# Patient Record
Sex: Male | Born: 2005 | Hispanic: Yes | Marital: Single | Smoking: Never smoker
Health system: Southern US, Community
[De-identification: ages and names within clinical notes are randomized; demographics above are authoritative.]

## PROBLEM LIST (undated history)

## (undated) DIAGNOSIS — N471 Phimosis: Secondary | ICD-10-CM

## (undated) HISTORY — DX: Phimosis: N47.1

---
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
## 2023-01-25 DIAGNOSIS — F332 Major depressive disorder, recurrent severe without psychotic features: Secondary | ICD-10-CM | POA: Diagnosis not present

## 2023-01-25 DIAGNOSIS — F410 Panic disorder [episodic paroxysmal anxiety] without agoraphobia: Secondary | ICD-10-CM | POA: Diagnosis not present

## 2023-01-28 ENCOUNTER — Other Ambulatory Visit: Payer: Self-pay | Admitting: Pediatrics

## 2023-01-28 DIAGNOSIS — E559 Vitamin D deficiency, unspecified: Secondary | ICD-10-CM

## 2023-02-03 DIAGNOSIS — H52209 Unspecified astigmatism, unspecified eye: Secondary | ICD-10-CM | POA: Insufficient documentation

## 2023-02-08 DIAGNOSIS — F410 Panic disorder [episodic paroxysmal anxiety] without agoraphobia: Secondary | ICD-10-CM | POA: Diagnosis not present

## 2023-02-08 DIAGNOSIS — F332 Major depressive disorder, recurrent severe without psychotic features: Secondary | ICD-10-CM | POA: Diagnosis not present

## 2023-02-22 DIAGNOSIS — F410 Panic disorder [episodic paroxysmal anxiety] without agoraphobia: Secondary | ICD-10-CM | POA: Diagnosis not present

## 2023-02-22 DIAGNOSIS — F332 Major depressive disorder, recurrent severe without psychotic features: Secondary | ICD-10-CM | POA: Diagnosis not present

## 2023-02-27 ENCOUNTER — Encounter: Payer: Self-pay | Admitting: Pediatrics

## 2023-02-27 ENCOUNTER — Ambulatory Visit (INDEPENDENT_AMBULATORY_CARE_PROVIDER_SITE_OTHER): Payer: Medicaid Other | Admitting: Pediatrics

## 2023-02-27 VITALS — Wt 345.0 lb

## 2023-02-27 DIAGNOSIS — Z131 Encounter for screening for diabetes mellitus: Secondary | ICD-10-CM

## 2023-02-27 DIAGNOSIS — F419 Anxiety disorder, unspecified: Secondary | ICD-10-CM | POA: Diagnosis not present

## 2023-02-27 DIAGNOSIS — R7989 Other specified abnormal findings of blood chemistry: Secondary | ICD-10-CM | POA: Diagnosis not present

## 2023-02-27 NOTE — Patient Instructions (Signed)
Adult Primary Care Clinics Name Criteria Services   [facility]  Address: [address] [place], [place] [address]  Phone: [phone] Hours: Monday - Friday 9 AM -6 PM  Types of insurance accepted:  Commercial insurance [facility] (orange card) Medicaid Medicare Uninsured  Language services:  Video and phone interpreters available   Ages 18 and older    Adult primary care Onsite pharmacy Integrated behavioral health Financial assistance counseling Walk-in hours for established patients  Financial assistance counseling hours: Tuesdays 2:00PM - 5:00PM  Thursday 8:30AM - 4:30PM  Space is limited, 10 on Tuesday and 20 on Thursday. It's on first come first serve basis  Name Criteria Services   [facility]  Address: [address] [place], [place] [address]  Phone: [phone]  Hours: Monday - Friday 8:30 AM - 5 PM  Types of insurance accepted:  Commercial insurance Medicaid Medicare Uninsured  Language services:  Video and phone interpreters available   All ages - newborn to adult   Primary care for all ages (children and adults) Integrated behavioral health Nutritionist Financial assistance counseling   Name Criteria Services   [facility]  Located on the ground floor of [facility]  Address: [address]  [place],  [place]  [address]  Phone: [phone]  Hours: Monday - Friday 8:15 AM - 5 PM  Types of insurance accepted:  Commercial insurance Medicaid Medicare Uninsured  Language services:  Video and phone interpreters available   Ages 18 and older   Adult primary care Nutritionist Certified Diabetes Educator  Integrated behavioral health Financial assistance counseling   Name Criteria Services   [facility]  Address: [address] [place], [place] [address]  Phone:  [phone]  Hours: Monday - Friday 8:30 AM - 5 PM    Types of insurance accepted:  Commercial insurance Medicaid Medicare Uninsured  Language services:  Video and phone interpreters available   All ages - newborn to adult   Primary care for all ages (children and adults) Integrated behavioral health Financial assistance counseling      

## 2023-02-27 NOTE — Progress Notes (Signed)
Subjective:     [name] [name], is a 17 y.o. male  HPI  Chief Complaint  Patient presents with   Follow-up    labs   Last well exam 10/2022: Since last visit: optometry astigmatism, glasses optional--FU appt in 2 year Not driving   At last visit:  Anxiety and Depression. Seeing psychiatry, Dr Jackquline Berlin His current medicines include:   amphetamine salt 40-- (not sure brand name Remmeron (Mirtaxipine) Trinellix- (Vortioxetine)  His psychiatrist recommended TMS: Transcranial magnetic stimulation, but Khyle did not want to do it  He is having frequent follow-up as his medicines have been changing and they were gradually increasing his amphetamine dosage He still spends most of his time in his room He still does not talk much with the family After graduation in August from high school, he has not yet started to look for a job.  He is not sure what he wants to do Mother says she sees no difference on his current medicines, but he is much more animated and participating in the visit with me  Academics Didn't  pass one class at the end of 12th grade Took summer school, passed and graduated in August [employer]   His BMI is increased since last visit 51--I suspect he is less active now is no longer in school  At most recent laboratory evaluation,  10/2022 TSH high 7.28 with normal T4--at risk for becoming hypothyroid Vit D low 12   HIV, ALT, A1C, normal Cholesterol : not fasting with increased TG, , normal LDL, HDL at 45  Hasn't got to any therapy--: family solutions been on the list for months but has not heard from them Sister goes to [facility] [place]-  Not Taking vit D --no keeps forgeting  History and Problem List: An has Mild persistent asthma; Obesity; Elevated blood pressure reading without diagnosis of hypertension; Second hand smoke exposure; Phimosis; Acanthosis nigricans; Needle phobia; Generalized anxiety disorder; Hearing examination; Failed  hearing screening; Allergic rhinitis; and Astigmatism on their problem list.  Inmer  has a past medical history of Asthma and Phimosis (2.10.14).     Objective:     Wt (!) 345 lb (156.5 kg)   BMI 51.10 kg/m   Physical Exam Constitutional:      Appearance: He is well-developed. He is obese.     Comments: Looks at me, responds to questions promptly, looks of his medicines on the Internet--- all much more engaged in process than previously  HENT:     Head: Normocephalic and atraumatic.     Nose: Nose normal.     Mouth/Throat:     Mouth: Mucous membranes are moist.     Pharynx: Oropharynx is clear.  Eyes:     Conjunctiva/sclera: Conjunctivae normal.  Neck:     Thyroid: No thyromegaly.  Cardiovascular:     Rate and Rhythm: Normal rate and regular rhythm.     Heart sounds: Normal heart sounds. No murmur heard. Pulmonary:     Effort: Pulmonary effort is normal.     Breath sounds: Normal breath sounds.  Abdominal:     Palpations: Abdomen is soft.     Tenderness: There is no abdominal tenderness.  Musculoskeletal:     Cervical back: Normal range of motion.  Lymphadenopathy:     Cervical: No cervical adenopathy.  Skin:    General: Skin is warm and dry.     Findings: No rash.  Neurological:     Mental Status: He is alert.  Assessment & Plan:   1. 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  He is likely to need thyroid at some time in the near future.  Continue to repeat TSH every 3 to 6 months  - TSH + free T4  2. Low vitamin D level  Very little outdoor activity Has not been taking vitamin D Strongly recommend taking vitamin D 2000-3000 international units daily  - VITAMIN D 25 Hydroxy (Vit-D Deficiency, Fractures)  3. Anxiety disorder, unspecified type  Medicines managed by psychiatry Mother has not seen significant changes at home, but I am impressed that he is more engaged with me than in  the past  - Ambulatory referral to Behavioral Health--recommend family services of the [place].  His sister goes there, and mother thinks she might be able to get him a therapist or sooner  I strongly recommend a therapist.  He will be difficult for him to reach outside the house to seek a job, and a therapist can supportive in that process  4. Screening for diabetes mellitus He remains at high risk for diabetes mellitus due to his severe obesity - HgB A1c  Now that he is 16 and graduated from high school, he should start seeking care with an adult PCP.  Provided with a list of adult PCP.  They have a waiting list and we will provide him with care until he gets into the clinic  Supportive care and return precautions reviewed.  Time spent reviewing chart in preparation for visit:  5 minutes Time spent face-to-face with patient: 20 minutes Time spent not face-to-face with patient for documentation and care coordination on date of service: 5 minutes   [name], MD

## 2023-02-28 LAB — HEMOGLOBIN A1C
Hgb A1c MFr Bld: 5.7 %{Hb} — ABNORMAL HIGH (ref <5.7)
Mean Plasma Glucose: 117 mg/dL
eAG (mmol/L): 6.5 mmol/L

## 2023-02-28 LAB — VITAMIN D 25 HYDROXY (VIT D DEFICIENCY, FRACTURES): Vit D, 25-Hydroxy: 10 ng/mL — ABNORMAL LOW (ref 30–100)

## 2023-02-28 LAB — TSH+FREE T4: TSH W/REFLEX TO FT4: 4.28 m[IU]/L (ref 0.50–4.30)

## 2023-03-01 DIAGNOSIS — F332 Major depressive disorder, recurrent severe without psychotic features: Secondary | ICD-10-CM | POA: Diagnosis not present

## 2023-03-01 DIAGNOSIS — F410 Panic disorder [episodic paroxysmal anxiety] without agoraphobia: Secondary | ICD-10-CM | POA: Diagnosis not present

## 2023-03-06 ENCOUNTER — Telehealth: Payer: Self-pay | Admitting: Pediatrics

## 2023-03-06 DIAGNOSIS — R7989 Other specified abnormal findings of blood chemistry: Secondary | ICD-10-CM

## 2023-03-06 DIAGNOSIS — R7303 Prediabetes: Secondary | ICD-10-CM

## 2023-03-06 NOTE — Telephone Encounter (Signed)
Recent visit to recheck thyroid (hx of increased TSH with normal free T 4)  And A1c , newly elevated into Pre-DM range  Also has low Vit D  Offered nutritionist and endocrine. Mother would like to see an endocrinologist

## 2023-05-10 DIAGNOSIS — F410 Panic disorder [episodic paroxysmal anxiety] without agoraphobia: Secondary | ICD-10-CM | POA: Diagnosis not present

## 2023-05-10 DIAGNOSIS — F332 Major depressive disorder, recurrent severe without psychotic features: Secondary | ICD-10-CM | POA: Diagnosis not present

## 2023-05-31 ENCOUNTER — Ambulatory Visit: Payer: Self-pay | Admitting: Pediatrics

## 2023-06-05 ENCOUNTER — Encounter: Payer: Self-pay | Admitting: Pediatrics

## 2023-06-05 ENCOUNTER — Ambulatory Visit (INDEPENDENT_AMBULATORY_CARE_PROVIDER_SITE_OTHER): Payer: Medicaid Other | Admitting: Pediatrics

## 2023-06-05 VITALS — BP 120/74 | Ht 69.13 in | Wt 345.5 lb

## 2023-06-05 DIAGNOSIS — E559 Vitamin D deficiency, unspecified: Secondary | ICD-10-CM

## 2023-06-05 DIAGNOSIS — J4530 Mild persistent asthma, uncomplicated: Secondary | ICD-10-CM

## 2023-06-05 DIAGNOSIS — Z6841 Body Mass Index (BMI) 40.0 and over, adult: Secondary | ICD-10-CM | POA: Diagnosis not present

## 2023-06-05 DIAGNOSIS — R7989 Other specified abnormal findings of blood chemistry: Secondary | ICD-10-CM

## 2023-06-05 DIAGNOSIS — R7303 Prediabetes: Secondary | ICD-10-CM

## 2023-06-05 DIAGNOSIS — L853 Xerosis cutis: Secondary | ICD-10-CM

## 2023-06-05 DIAGNOSIS — J309 Allergic rhinitis, unspecified: Secondary | ICD-10-CM

## 2023-06-05 MED ORDER — VENTOLIN HFA 108 (90 BASE) MCG/ACT IN AERS: 2.0000 | INHALATION_SPRAY | Freq: Four times a day (QID) | RESPIRATORY_TRACT | 0 refills | Status: DC | PRN

## 2023-06-05 MED ORDER — CETIRIZINE HCL 10 MG PO TABS: 10.0000 mg | ORAL_TABLET | Freq: Every day | ORAL | 11 refills | Status: AC

## 2023-06-05 MED ORDER — FLUTICASONE PROPIONATE 50 MCG/ACT NA SUSP: 2.0000 | Freq: Every day | NASAL | 11 refills | Status: AC

## 2023-06-05 NOTE — Progress Notes (Signed)
 Subjective:     [name] [name], is a 18 y.o. male  HPI  Chief Complaint  Patient presents with   Follow-up    Check thyroid  levels and sugar    Consent from patient to speak with mother regarding his concerns today  Last well visit 10/2022  Hx of severe anxiety --poorly responsive to fluoxetine  and sertraline , now managed by psychiatry last visit in Epic 09/2022  Seen most recently at this clinic 02/2023 with concern for abnormal TSH Also hx of Vit D deficiency --forgetting to take supplements Abnormal lipid panel 10/2022: (low HDL and Hi Triglycerides , likely non-fasting)   Also hx of asthma 04/2022  Currently using meds Vortioxetine (trintellix) Mirtazapine 30 (remeron ) Does seem help with sleep better (which was a problem) Mom not note a difference in his activity  He feel no difference : still anxiety and depression Discussing electrical therapy--TMS--daily  Starting with a new therapist first  Also has mild cold symptoms today Mother is concerned it is bringing up his asthma Cough a little  More about one week Fever: no , no chills No vomiting, no diarrhea Cough seems like asthma Hasn't tried his albuterol   History and Problem List: [name] has Mild persistent asthma; Obesity; Elevated blood-pressure reading without diagnosis of hypertension; Second hand smoke exposure; Phimosis; Acanthosis nigricans; Needle phobia; Generalized anxiety disorder; Hearing examination; Failed hearing screening; Allergic rhinitis; and Astigmatism on their problem list.  [name]  has a past medical history of Asthma and Phimosis (2.10.14).     Objective:     BP 120/74 (BP Location: Left Arm, Patient Position: Sitting, Cuff Size: Large)   Ht 5' 9.13 (1.756 m)   Wt (!) 345 lb 8 oz (156.7 kg)   BMI 50.82 kg/m   Physical Exam Constitutional:      Appearance: Normal appearance. He is obese. He is not ill-appearing.  HENT:     Nose:     Comments: Slight rhinorrhea     Mouth/Throat:     Mouth: Mucous membranes are moist.     Pharynx: Oropharynx is clear.  Eyes:     Conjunctiva/sclera: Conjunctivae normal.  Cardiovascular:     Heart sounds: Normal heart sounds.  Pulmonary:     Effort: Pulmonary effort is normal.     Breath sounds: Normal breath sounds.  Skin:    Comments: Extensive acanthosis hand very dry and chapped with mild erythema  Neurological:     Mental Status: He is alert.        Assessment & Plan:   1. Abnormal thyroid  blood test (Primary)  Previously noted elevated TSH with normal free T4, repeat screening   - TSH - T4, free  2. Pre-diabetes  First abnormal testing 02/2023  He is not interested in nutritionist or weight loss clinic at this time.  He would like to work on this by himself  Discussed the availability of metformin  - HgB A1c  3. Vitamin D  deficiency  Please take your vitamin D  as it is important for bone health but it can also help your mood  - VITAMIN D  25 Hydroxy (Vit-D Deficiency, Fractures)  4. Mild persistent asthma, uncomplicated  I do not hear wheezing, but he does have diminished breath sounds that I attribute to taking chest wall.  Please do try albuterol  during this URI for your cough Please do use your spacer  - VENTOLIN  HFA 108 (90 Base) MCG/ACT inhaler; Inhale 2 puffs into the lungs every 6 (six) hours as  needed for wheezing or shortness of breath.  Dispense: 1 each; Refill: 0  5. Allergic rhinitis, unspecified seasonality, unspecified trigger Refill for cetirizine  and Flonase  requested - cetirizine  (ZYRTEC ) 10 MG tablet; Take 1 tablet (10 mg total) by mouth daily.  Dispense: 30 tablet; Refill: 11  6. BMI 50.0-59.9, adult (HCC)  As noted above, severe obesity Not currently motivated to make significant changes  7.  Dry skin dermatitis Particularly bad on his hands Is not currently using any moisturizer--recommend using moisturizer several times a day Recommend thick heavy  moisturizer Recommend thickening moisturizer at bedtime and covered with socks Limit hand sanitizer use  Please continue to look for an adult clinic  Supportive care and return precautions reviewed.  Time spent reviewing chart in preparation for visit:  7 minutes Time spent face-to-face with patient: 25 minutes Time spent not face-to-face with patient for documentation and care coordination on date of service: 5 minutes   Kreg Helena, MD

## 2023-06-05 NOTE — Patient Instructions (Addendum)
 Adult Primary Care Clinics Name Criteria Services   [facility]  Address: [address] [place], [place] [address]  Phone: [phone] Hours: Monday - Friday 9 AM -6 PM  Types of insurance accepted:  Commercial insurance [place] [employer] (orange card) [employer] Uninsured  Language services:  Video and phone interpreters available   Ages 35 and older    Adult primary care Onsite pharmacy Integrated behavioral health Financial assistance counseling Walk-in hours for established patients  Financial assistance counseling hours: Tuesdays 2:00PM - 5:00PM  Thursday 8:30AM - 4:30PM  Space is limited, 10 on Tuesday and 20 on Thursday. It's on first come first serve basis  Name Criteria Services   [facility]  Address: [address] [place], [place] [address]  Phone: [phone]  Hours: Monday - Friday 8:30 AM - 5 PM  Types of insurance accepted:  Commercial insurance Medicaid Medicare Uninsured  Language services:  Video and phone interpreters available   All ages - newborn to adult   Primary care for all ages (children and adults) Integrated behavioral health Nutritionist Financial assistance counseling   Name Criteria Services   [facility]  Located on the ground floor of [facility]  Address: [address]  [place],  [place]  [address]  Phone: [phone]  Hours: Monday - Friday 8:15 AM - 5 PM  Types of insurance accepted:  Commercial insurance Medicaid Medicare Uninsured  Language services:  Video and phone interpreters available   Ages 83 and older   Adult primary care Nutritionist Certified Diabetes Educator  Integrated behavioral health Financial assistance counseling   Name Criteria Services   [facility]  Address: [address] [place], [place] [address]  Phone:  [phone]  Hours: Monday - Friday 8:30 AM - 5 PM    Types of insurance accepted:  Nurse, learning disability Medicaid Medicare Uninsured  Language services:  Video and phone interpreters available   All ages - newborn to adult   Primary care for all ages (children and adults) Integrated behavioral health Financial assistance counseling    Dental list - Updated 02/14/2023  These dentists accept Medicaid.  The list is a courtesy and for your convenience. Estos dentistas aceptan Medicaid.  La lista es para su conveniencia y es una cortesa.    [facility] [phone] [address] [place] [address] Se habla espaol Ages 16 to 20 years old Accepts ALL Medicaid plans Cornelious Bartolucci DDS  [id number] Clancy Hammersmith, DDS (Spanish speaking) [address] [place]  [address] Se habla espaol New patients must be 6 or under. Can remain established until age 28 Parent may go with child if needed Accepts ALL Medicaid plans  Nikki armin Nikki DMD  [phone] [address] [place] [address] Se habla espaol Vietnamese spoken Ages 1 up through adulthood Parent may go with child Accepts ALL Medicaid plans other than family planning Medicaid [facility]  [phone] [address] [place] [address] Se habla espaol Ages 1-20 Ages 1-3y parents may go back 4+ go back by themselves parents can watch at "[place]" Accepts ALL Medicaid plans  [facility] DDS  [phone]  [address]  [place] [address] Vietnamese spoken New patients must be ages 66 or under. Can remain established until age 12 Approx 3 month wait time  Parent may  go with child Accepts ALL Medicaid plans [facility]     [phone] [address] [place]. [place] [address] Requires certification. Call for information. Requiere certificacin. Llame para informacin. Algunos dias se habla  espaol  From birth to 20 years Parent possibly goes with child Accepts ALL Medicaid plans  Abran Kenner DDS  [phone] [address] [place] [address] Se habla espaol  Ages 13 months to 51 years old Parent may go with child Accepts ALL Medicaid plans [facility] DDS     Camellia DOROTHA Cagey DDS  [phone] [address] [place] [address] Se habla espaol- phone interpreters Age 10yo and up through adulthood Approx 3 month wait time Parent may go with child, 15+ go back alone Accepts ALL Medicaid plans  [facility] [phone] Se habla espaol [address] [place], [place] [address]  Ages 29 and under only  Accepts ALL Medicaid plans [facility] [id number] [address] [place] [address] Se habla espanol Interpretation for other languages on a tablet Special needs children welcome Ages 5 and under Accepts ALL Medicaid plans  Elza Hamburger DDS   [phone] [address] [place] [address] Se habla espaol Ages 4 to 76 Parent may NOT go with child Accepts ALL Medicaid plans [facility] [phone] [address] [place], [place] [address]  Se habla espaol Ages 59 and under only Parents may go back with child  Accepts ALL Medicaid plans  [facility] [phone] Dr. Leita Lust [address] [place], [place] [address] Se habla espaol Ages 78 and under Special needs children welcome Accepts ALL Medicaid plans

## 2023-06-06 LAB — T4, FREE: Free T4: 1.3 ng/dL (ref 0.8–1.4)

## 2023-06-06 LAB — HEMOGLOBIN A1C
Hgb A1c MFr Bld: 5.6 %{Hb} (ref <5.7)
Mean Plasma Glucose: 114 mg/dL
eAG (mmol/L): 6.3 mmol/L

## 2023-06-06 LAB — VITAMIN D 25 HYDROXY (VIT D DEFICIENCY, FRACTURES): Vit D, 25-Hydroxy: 6 ng/mL — ABNORMAL LOW (ref 30–100)

## 2023-06-06 LAB — TSH: TSH: 7.62 m[IU]/L — ABNORMAL HIGH (ref 0.50–4.30)

## 2023-07-04 ENCOUNTER — Other Ambulatory Visit: Payer: Self-pay | Admitting: Pediatrics

## 2023-07-04 DIAGNOSIS — J4530 Mild persistent asthma, uncomplicated: Secondary | ICD-10-CM

## 2023-07-25 DIAGNOSIS — F410 Panic disorder [episodic paroxysmal anxiety] without agoraphobia: Secondary | ICD-10-CM | POA: Diagnosis not present

## 2023-07-25 DIAGNOSIS — F332 Major depressive disorder, recurrent severe without psychotic features: Secondary | ICD-10-CM | POA: Diagnosis not present

## 2023-08-07 DIAGNOSIS — F39 Unspecified mood [affective] disorder: Secondary | ICD-10-CM | POA: Diagnosis not present

## 2023-08-09 ENCOUNTER — Other Ambulatory Visit: Payer: Self-pay | Admitting: Pediatrics

## 2023-08-09 DIAGNOSIS — E559 Vitamin D deficiency, unspecified: Secondary | ICD-10-CM

## 2023-08-21 DIAGNOSIS — F419 Anxiety disorder, unspecified: Secondary | ICD-10-CM | POA: Diagnosis not present

## 2023-09-04 ENCOUNTER — Ambulatory Visit: Payer: Medicaid Other | Admitting: Pediatrics

## 2023-09-04 DIAGNOSIS — F419 Anxiety disorder, unspecified: Secondary | ICD-10-CM | POA: Diagnosis not present

## 2023-09-11 ENCOUNTER — Ambulatory Visit (INDEPENDENT_AMBULATORY_CARE_PROVIDER_SITE_OTHER): Admitting: Pediatrics

## 2023-09-11 ENCOUNTER — Encounter: Payer: Self-pay | Admitting: Pediatrics

## 2023-09-11 VITALS — BP 126/82 | Ht 68.9 in | Wt 353.8 lb

## 2023-09-11 DIAGNOSIS — H6122 Impacted cerumen, left ear: Secondary | ICD-10-CM

## 2023-09-11 DIAGNOSIS — Z131 Encounter for screening for diabetes mellitus: Secondary | ICD-10-CM

## 2023-09-11 DIAGNOSIS — R7989 Other specified abnormal findings of blood chemistry: Secondary | ICD-10-CM

## 2023-09-11 DIAGNOSIS — Z01110 Encounter for hearing examination following failed hearing screening: Secondary | ICD-10-CM

## 2023-09-11 DIAGNOSIS — R94120 Abnormal auditory function study: Secondary | ICD-10-CM

## 2023-09-11 NOTE — Progress Notes (Signed)
 Subjective:     [name] [name], is a 18 y.o. male  HPI  Chief Complaint  Patient presents with   Follow-up    Follow up on labs and depression. Per patient depression seems to be the same nothing has changed, No concerns.    Last well visit 10/2022 Low vit d ,  Abnormal TSH (7.62) , normal free T 4 (1.3) Anxiety seeing psychiatry Abnormal A1C 5.6 05/2023 Has been told to get new provider--graduated HS Recommended therapist  Asthma--not like before, last heard wheezing 3 months ago--per mom  Allergies rhinitis-- not right now  Not using cetirizine  or flonase   Is taking Vit D 2000--forgets very often, taking it daily for last 2 weeks  Psychiatry: trintellix and mirtazapine "Not really working" Did start therapy [facility] like sister about 5 weeks ago Still never leave house Not working Waves, and smiles at me and answer questions well  Has not started to make changes for his weight-despite encouragement Going to start this week to zero sugar sugar He drinks too much soda-- 3-4 8 ounces a day of soda Exercise--not decided what to do Started talking about these goals with his therapist  Sleep is better since mirazapine started  Sleep is good    History and Problem List: [name] [name] has Mild persistent asthma; Obesity; Elevated blood-pressure reading without diagnosis of hypertension; Second hand smoke exposure; Phimosis; Acanthosis nigricans; Needle phobia; Generalized anxiety disorder; Hearing examination; Failed hearing screening; Allergic rhinitis; and Astigmatism on their problem list.  [name] [name]  has a past medical history of Asthma and Phimosis (2.10.14).     Objective:     BP 126/82 (BP Location: Right Arm, Patient Position: Sitting, Cuff Size: Large)   Ht 5' 8.9" (1.75 m)   Wt (!) 353 lb 12.8 oz (160.5 kg)   BMI 52.40 kg/m   Physical Exam Constitutional:      General: He is not in acute distress.    Appearance: He is well-developed. He  is obese.  HENT:     Head: Normocephalic and atraumatic.     Ears:     Comments: Left ear impacted with cerumen, cleaned by curettage and irrigation (CMA) after irrigation, TM clear and passed hearing test    Nose: Nose normal.     Mouth/Throat:     Mouth: Mucous membranes are moist.     Pharynx: Oropharynx is clear.  Eyes:     General:        Right eye: No discharge.        Left eye: No discharge.     Conjunctiva/sclera: Conjunctivae normal.  Neck:     Thyroid : No thyromegaly.  Cardiovascular:     Rate and Rhythm: Normal rate and regular rhythm.     Heart sounds: Normal heart sounds. No murmur heard. Pulmonary:     Effort: No respiratory distress.  Abdominal:     General: There is no distension.     Palpations: Abdomen is soft.     Tenderness: There is no abdominal tenderness.  Musculoskeletal:     Cervical back: Normal range of motion.  Lymphadenopathy:     Cervical: No cervical adenopathy.  Skin:    General: Skin is warm and dry.     Findings: No rash.  Neurological:     Mental Status: He is alert.        Assessment & Plan:   1. Abnormal thyroid  blood test (Primary)  Is at risk for hypothyroidism, now stable for several  months , could check every 6 months  - TSH + free T4  2. Low vitamin D  level Now started taking vit d (an indication of improved self care - VITAMIN D  25 Hydroxy (Vit-D Deficiency, Fractures)  3. Screening for diabetes mellitus At high risk for DM - Hemoglobin A1c  4. Failed hearing screening In past, passed today   5. Impacted cerumen of left ear Resolved with irrigation   BMI 175 %ile   BMI 52  Decisions were made and discussed with caregiver who was in agreement.   Supportive care and return precautions reviewed.  Time spent reviewing chart in preparation for visit:  4 minutes Time spent face-to-face with patient: 15 minutes Time spent not face-to-face with patient for documentation and care coordination on date of service: 3  minutes   Lavonda Pour, MD

## 2023-09-11 NOTE — Patient Instructions (Signed)
 Adult Primary Care Clinics Name Criteria Services   [facility]  Address: [address] [place], [place] [address]  Phone: [phone] Hours: Monday - Friday 9 AM -6 PM  Types of insurance accepted:  Commercial insurance [place] [employer] (orange card) [employer] Uninsured  Language services:  Video and phone interpreters available   Ages 73 and older    Adult primary care Onsite pharmacy Integrated behavioral health Financial assistance counseling Walk-in hours for established patients  Financial assistance counseling hours: Tuesdays 2:00PM - 5:00PM  Thursday 8:30AM - 4:30PM  Space is limited, 10 on Tuesday and 20 on Thursday. It's on first come first serve basis  Name Criteria Services   [facility]  Address: [address] [place], [place] [address]  Phone: [phone]  Hours: Monday - Friday 8:30 AM - 5 PM  Types of insurance accepted:  Commercial insurance Medicaid Medicare Uninsured  Language services:  Video and phone interpreters available   All ages - newborn to adult   Primary care for all ages (children and adults) Integrated behavioral health Nutritionist Financial assistance counseling   Name Criteria Services   [facility]  Located on the ground floor of [facility]  Address: [address]  [place],  [place]  [address]  Phone: [phone]  Hours: Monday - Friday 8:15 AM - 5 PM  Types of insurance accepted:  Commercial insurance Medicaid Medicare Uninsured  Language services:  Video and phone interpreters available   Ages 41 and older   Adult primary care Nutritionist Certified Diabetes Educator  Integrated behavioral health Financial assistance counseling   Name Criteria Services   [facility]  Address: [address] [place], [place] [address]  Phone:  [phone]  Hours: Monday - Friday 8:30 AM - 5 PM    Types of insurance accepted:  Nurse, learning disability Medicaid Medicare Uninsured  Language services:  Video and phone interpreters available   All ages - newborn to adult   Primary care for all ages (children and adults) Integrated behavioral health Financial assistance counseling

## 2023-09-12 ENCOUNTER — Encounter: Payer: Self-pay | Admitting: Pediatrics

## 2023-09-12 LAB — VITAMIN D 25 HYDROXY (VIT D DEFICIENCY, FRACTURES): Vit D, 25-Hydroxy: 16 ng/mL — ABNORMAL LOW (ref 30–100)

## 2023-09-12 LAB — T4, FREE: Free T4: 1.4 ng/dL (ref 0.8–1.4)

## 2023-09-12 LAB — HEMOGLOBIN A1C
Hgb A1c MFr Bld: 5.6 % (ref <5.7)
Mean Plasma Glucose: 114 mg/dL
eAG (mmol/L): 6.3 mmol/L

## 2023-09-12 LAB — TSH+FREE T4: TSH W/REFLEX TO FT4: 6.19 m[IU]/L — ABNORMAL HIGH (ref 0.50–4.30)

## 2023-09-18 DIAGNOSIS — F419 Anxiety disorder, unspecified: Secondary | ICD-10-CM | POA: Diagnosis not present

## 2023-10-05 DIAGNOSIS — F419 Anxiety disorder, unspecified: Secondary | ICD-10-CM | POA: Diagnosis not present

## 2023-10-17 DIAGNOSIS — F332 Major depressive disorder, recurrent severe without psychotic features: Secondary | ICD-10-CM | POA: Diagnosis not present

## 2023-10-17 DIAGNOSIS — F419 Anxiety disorder, unspecified: Secondary | ICD-10-CM | POA: Diagnosis not present

## 2023-10-17 DIAGNOSIS — F410 Panic disorder [episodic paroxysmal anxiety] without agoraphobia: Secondary | ICD-10-CM | POA: Diagnosis not present

## 2023-10-31 DIAGNOSIS — F419 Anxiety disorder, unspecified: Secondary | ICD-10-CM | POA: Diagnosis not present

## 2023-11-14 DIAGNOSIS — F419 Anxiety disorder, unspecified: Secondary | ICD-10-CM | POA: Diagnosis not present

## 2023-11-28 DIAGNOSIS — F419 Anxiety disorder, unspecified: Secondary | ICD-10-CM | POA: Diagnosis not present

## 2023-12-12 DIAGNOSIS — F419 Anxiety disorder, unspecified: Secondary | ICD-10-CM | POA: Diagnosis not present

## 2023-12-26 DIAGNOSIS — F419 Anxiety disorder, unspecified: Secondary | ICD-10-CM | POA: Diagnosis not present

## 2024-01-08 DIAGNOSIS — F410 Panic disorder [episodic paroxysmal anxiety] without agoraphobia: Secondary | ICD-10-CM | POA: Diagnosis not present

## 2024-01-08 DIAGNOSIS — F332 Major depressive disorder, recurrent severe without psychotic features: Secondary | ICD-10-CM | POA: Diagnosis not present

## 2024-01-08 DIAGNOSIS — F900 Attention-deficit hyperactivity disorder, predominantly inattentive type: Secondary | ICD-10-CM | POA: Diagnosis not present

## 2024-01-29 DIAGNOSIS — F419 Anxiety disorder, unspecified: Secondary | ICD-10-CM | POA: Diagnosis not present

## 2024-02-12 DIAGNOSIS — F419 Anxiety disorder, unspecified: Secondary | ICD-10-CM | POA: Diagnosis not present

## 2024-02-21 ENCOUNTER — Other Ambulatory Visit: Payer: Self-pay | Admitting: Pediatrics

## 2024-02-21 DIAGNOSIS — E559 Vitamin D deficiency, unspecified: Secondary | ICD-10-CM

## 2024-02-21 NOTE — Telephone Encounter (Signed)
1 refill available

## 2024-03-04 DIAGNOSIS — F419 Anxiety disorder, unspecified: Secondary | ICD-10-CM | POA: Diagnosis not present

## 2024-04-01 DIAGNOSIS — F419 Anxiety disorder, unspecified: Secondary | ICD-10-CM | POA: Diagnosis not present

## 2024-04-08 DIAGNOSIS — F332 Major depressive disorder, recurrent severe without psychotic features: Secondary | ICD-10-CM | POA: Diagnosis not present

## 2024-04-08 DIAGNOSIS — F900 Attention-deficit hyperactivity disorder, predominantly inattentive type: Secondary | ICD-10-CM | POA: Diagnosis not present

## 2024-04-08 DIAGNOSIS — F410 Panic disorder [episodic paroxysmal anxiety] without agoraphobia: Secondary | ICD-10-CM | POA: Diagnosis not present

## 2024-04-15 DIAGNOSIS — F419 Anxiety disorder, unspecified: Secondary | ICD-10-CM | POA: Diagnosis not present

## 2024-05-06 DIAGNOSIS — F419 Anxiety disorder, unspecified: Secondary | ICD-10-CM | POA: Diagnosis not present

## 2024-05-22 ENCOUNTER — Telehealth: Payer: Self-pay | Admitting: Pediatrics

## 2024-05-22 NOTE — Telephone Encounter (Signed)
 05/22/2024:LVM to schedule wcc.
